# Patient Record
Sex: Female | Born: 1965 | Race: White | Hispanic: No | Marital: Married | Smoking: Never smoker
Health system: Southern US, Community
[De-identification: ages and names within clinical notes are randomized; demographics above are authoritative.]

## PROBLEM LIST (undated history)

## (undated) DIAGNOSIS — T7840XA Allergy, unspecified, initial encounter: Secondary | ICD-10-CM

## (undated) DIAGNOSIS — Z87440 Personal history of urinary (tract) infections: Secondary | ICD-10-CM

## (undated) HISTORY — DX: Allergy, unspecified, initial encounter: T78.40XA

## (undated) HISTORY — DX: Personal history of urinary (tract) infections: Z87.440

## (undated) HISTORY — PX: RHINOPLASTY: SUR1284

## (undated) HISTORY — PX: AUGMENTATION MAMMAPLASTY: SUR837

## (undated) HISTORY — PX: EYE SURGERY: SHX253

---

## 2016-07-07 ENCOUNTER — Emergency Department (HOSPITAL_COMMUNITY)
Admission: EM | Admit: 2016-07-07 | Discharge: 2016-07-07 | Disposition: A | Discharge status: Home / Self Care | Payer: No Typology Code available for payment source | Attending: Emergency Medicine | Admitting: Emergency Medicine

## 2016-07-07 ENCOUNTER — Emergency Department (HOSPITAL_COMMUNITY): Payer: No Typology Code available for payment source

## 2016-07-07 ENCOUNTER — Encounter (HOSPITAL_COMMUNITY): Payer: Self-pay

## 2016-07-07 DIAGNOSIS — S199XXA Unspecified injury of neck, initial encounter: Secondary | ICD-10-CM | POA: Insufficient documentation

## 2016-07-07 DIAGNOSIS — R1032 Left lower quadrant pain: Secondary | ICD-10-CM | POA: Diagnosis not present

## 2016-07-07 DIAGNOSIS — R519 Headache, unspecified: Secondary | ICD-10-CM

## 2016-07-07 DIAGNOSIS — Y999 Unspecified external cause status: Secondary | ICD-10-CM | POA: Diagnosis not present

## 2016-07-07 DIAGNOSIS — Y92410 Unspecified street and highway as the place of occurrence of the external cause: Secondary | ICD-10-CM | POA: Diagnosis not present

## 2016-07-07 DIAGNOSIS — Y939 Activity, unspecified: Secondary | ICD-10-CM | POA: Diagnosis not present

## 2016-07-07 DIAGNOSIS — M542 Cervicalgia: Secondary | ICD-10-CM

## 2016-07-07 DIAGNOSIS — R51 Headache: Secondary | ICD-10-CM | POA: Diagnosis not present

## 2016-07-07 LAB — I-STAT CHEM 8, ED
BUN: 9 mg/dL (ref 6–20)
CREATININE: 1 mg/dL (ref 0.44–1.00)
Calcium, Ion: 1.19 mmol/L (ref 1.15–1.40)
Chloride: 104 mmol/L (ref 101–111)
GLUCOSE: 94 mg/dL (ref 65–99)
HCT: 40 % (ref 36.0–46.0)
HEMOGLOBIN: 13.6 g/dL (ref 12.0–15.0)
POTASSIUM: 4.1 mmol/L (ref 3.5–5.1)
Sodium: 142 mmol/L (ref 135–145)
TCO2: 26 mmol/L (ref 0–100)

## 2016-07-07 MED ORDER — IOPAMIDOL (ISOVUE-300) INJECTION 61%
INTRAVENOUS | Status: AC
  Administered 2016-07-07: 100 mL
  Filled 2016-07-07: qty 100

## 2016-07-07 MED ORDER — IBUPROFEN 800 MG PO TABS
800.0000 mg | ORAL_TABLET | Freq: Once | ORAL | Status: AC
  Administered 2016-07-07: 800 mg via ORAL
  Filled 2016-07-07: qty 1

## 2016-07-07 MED ORDER — METHOCARBAMOL 500 MG PO TABS
500.0000 mg | ORAL_TABLET | Freq: Once | ORAL | Status: AC
  Administered 2016-07-07: 500 mg via ORAL
  Filled 2016-07-07: qty 1

## 2016-07-07 MED ORDER — IBUPROFEN 800 MG PO TABS: 800.0000 mg | ORAL_TABLET | Freq: Three times a day (TID) | ORAL | 0 refills | Status: AC | PRN

## 2016-07-07 MED ORDER — METHOCARBAMOL 500 MG PO TABS: 500.0000 mg | ORAL_TABLET | Freq: Two times a day (BID) | ORAL | 0 refills | Status: AC | PRN
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
## 2016-10-25 ENCOUNTER — Encounter: Payer: Self-pay | Admitting: Family Medicine

## 2017-09-14 ENCOUNTER — Encounter: Payer: Self-pay | Admitting: Family Medicine

## 2017-11-16 ENCOUNTER — Other Ambulatory Visit: Payer: Self-pay | Admitting: *Deleted

## 2017-11-16 ENCOUNTER — Other Ambulatory Visit: Payer: Self-pay | Admitting: Family Medicine

## 2017-11-16 DIAGNOSIS — Z1231 Encounter for screening mammogram for malignant neoplasm of breast: Secondary | ICD-10-CM

## 2017-12-06 ENCOUNTER — Ambulatory Visit
Admission: RE | Admit: 2017-12-06 | Discharge: 2017-12-06 | Disposition: A | Discharge status: Home / Self Care | Payer: Commercial Managed Care - PPO | Source: Ambulatory Visit | Attending: Family Medicine | Admitting: Family Medicine

## 2017-12-06 DIAGNOSIS — Z1231 Encounter for screening mammogram for malignant neoplasm of breast: Secondary | ICD-10-CM

## 2017-12-08 ENCOUNTER — Other Ambulatory Visit: Payer: Self-pay | Admitting: Family Medicine

## 2017-12-08 DIAGNOSIS — R928 Other abnormal and inconclusive findings on diagnostic imaging of breast: Secondary | ICD-10-CM

## 2017-12-13 ENCOUNTER — Other Ambulatory Visit: Payer: Commercial Managed Care - PPO

## 2017-12-15 ENCOUNTER — Ambulatory Visit
Admission: RE | Admit: 2017-12-15 | Discharge: 2017-12-15 | Disposition: A | Discharge status: Home / Self Care | Payer: Commercial Managed Care - PPO | Source: Ambulatory Visit | Attending: Family Medicine | Admitting: Family Medicine

## 2017-12-15 ENCOUNTER — Ambulatory Visit: Admission: RE | Admit: 2017-12-15 | Payer: Commercial Managed Care - PPO | Source: Ambulatory Visit

## 2017-12-15 DIAGNOSIS — R928 Other abnormal and inconclusive findings on diagnostic imaging of breast: Secondary | ICD-10-CM

## 2017-12-20 ENCOUNTER — Encounter: Payer: Self-pay | Admitting: Family Medicine

## 2017-12-20 ENCOUNTER — Ambulatory Visit (INDEPENDENT_AMBULATORY_CARE_PROVIDER_SITE_OTHER): Payer: Commercial Managed Care - PPO | Admitting: Family Medicine

## 2017-12-20 VITALS — BP 123/76 | HR 74 | Temp 98.3°F | Resp 12 | Ht 66.0 in | Wt 185.0 lb

## 2017-12-20 DIAGNOSIS — Z23 Encounter for immunization: Secondary | ICD-10-CM

## 2017-12-20 DIAGNOSIS — Z0000 Encounter for general adult medical examination without abnormal findings: Secondary | ICD-10-CM

## 2017-12-20 DIAGNOSIS — Z789 Other specified health status: Secondary | ICD-10-CM

## 2017-12-20 DIAGNOSIS — Z111 Encounter for screening for respiratory tuberculosis: Secondary | ICD-10-CM

## 2017-12-20 NOTE — Patient Instructions (Addendum)
A few things to remember from today's visit:   Routine general medical examination at a health care facility  Unknown varicella vaccination status - Plan: Varicella Zoster Antibody, IgG  Hepatitis B vaccination status unknown - Plan: Hepatitis B surface antibody, Hepatitis B surface antigen  Today you have you routine preventive visit.  At least 150 minutes of moderate exercise per week, daily brisk walking for 15-30 min is a good exercise option. Healthy diet low in saturated (animal) fats and sweets and consisting of fresh fruits and vegetables, lean meats such as fish and white chicken and whole grains.  These are some of recommendations for screening depending of age and risk factors:   - Vaccines:  Tdap vaccine every 10 years.  Shingles vaccine recommended at age 53, could be given after 53 years of age but not sure about insurance coverage.   Pneumonia vaccines:  Prevnar 13 at 65 and Pneumovax at 66. Sometimes Pneumovax is giving earlier if history of smoking, lung disease,diabetes,kidney disease among some.    Screening for diabetes at age 640 and every 3 years.  Cervical cancer prevention: Please schedule appt with gyn.  Pap smear starts at 53 years of age and continues periodically until 53 years old in low risk women. Pap smear every 3 years between 5321 and 53 years old. Pap smear every 3-5 years between women 30 and older if pap smear negative and HPV screening negative.   -Breast cancer: Mammogram: There is disagreement between experts about when to start screening in low risk asymptomatic female but recent recommendations are to start screening at 7640 and not later than 53 years old , every 1-2 years and after 53 yo q 2 years. Screening is recommended until 53 years old but some women can continue screening depending of healthy issues.   Colon cancer screening: starts at 53 years old until 53 years old. Stool cards annually.  Cholesterol disorder screening at age 53  and every 3 years.  Also recommended:  1. Dental visit- Brush and floss your teeth twice daily; visit your dentist twice a year. 2. Eye doctor- Get an eye exam at least every 2 years. 3. Helmet use- Always wear a helmet when riding a bicycle, motorcycle, rollerblading or skateboarding. 4. Safe sex- If you may be exposed to sexually transmitted infections, use a condom. 5. Seat belts- Seat belts can save your live; always wear one. 6. Smoke/Carbon Monoxide detectors- These detectors need to be installed on the appropriate level of your home. Replace batteries at least once a year. 7. Skin cancer- When out in the sun please cover up and use sunscreen 15 SPF or higher. 8. Violence- If anyone is threatening or hurting you, please tell your healthcare provider.  9. Drink alcohol in moderation- Limit alcohol intake to one drink or less per day. Never drink and drive.   Please be sure medication list is accurate. If a new problem present, please set up appointment sooner than planned today.

## 2017-12-20 NOTE — Progress Notes (Signed)
HPI:   Bonnie Carter is a 52 y.o. female, who is here today to establish care.  Former PCP: Integration medicine provider. Last preventive routine visit: 3-4 years ago.  She had labs done in 09/2017 "[facility] female panel" and FLP done 10/2016.  Chronic medical problems: Allergies,mild HLD, and UTI among some.  10/2016 TC 209, HDL 127,HDL 58,TG 121.   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  Last pap smear about 3 years ago,she is planning on arranging appt with her gyn. Colonoscopy: Never. Mammogram: 11/2017.  She is not sure about vaccination.  She exercises regularly but has not been consistent with a healthy diet.   Review of Systems  Constitutional: Negative for appetite change, fatigue and fever.  HENT: Negative for hearing loss, mouth sores, sore throat, trouble swallowing and voice change.   Eyes: Negative for redness and visual disturbance.  Respiratory: Negative for cough, shortness of breath and wheezing.   Cardiovascular: Negative for chest pain and leg swelling.  Gastrointestinal: Negative for abdominal pain, nausea and vomiting.       No changes in bowel habits.  Endocrine: Negative for cold intolerance, heat intolerance, polydipsia, polyphagia and polyuria.  Genitourinary: Negative for decreased urine volume, dysuria, hematuria, vaginal bleeding and vaginal discharge.  Musculoskeletal: Negative for gait problem and myalgias.  Skin: Negative for color change and rash.  Allergic/Immunologic: Positive for environmental allergies.  Neurological: Negative for syncope, weakness, numbness and headaches.  Psychiatric/Behavioral: Negative for confusion and sleep disturbance. The patient is not nervous/anxious.   All other systems reviewed and are negative.     Current Outpatient Medications on File Prior to Visit  Medication Sig Dispense Refill  . BIOTIN PO Take by  mouth daily.    . cholecalciferol (VITAMIN D) 1000 units tablet Take 5,000 Units by mouth daily.    Marland Kitchen CRANBERRY PO Take by mouth daily.    Marland Kitchen ibuprofen (ADVIL,MOTRIN) 800 MG tablet Take 1 tablet (800 mg total) by mouth every 8 (eight) hours as needed. 21 tablet 0  . methocarbamol (ROBAXIN) 500 MG tablet Take 1 tablet (500 mg total) by mouth 2 (two) times daily as needed for muscle spasms. (Patient not taking: Reported on 12/20/2017) 20 tablet 0   No current facility-administered medications on file prior to visit.      Past Medical History:  Diagnosis Date  . Allergy   . Hx: UTI (urinary tract infection)    Allergies  Allergen Reactions  . Bactrim [Sulfamethoxazole-Trimethoprim]     resistant  . Vicodin [Hydrocodone-Acetaminophen] Nausea And Vomiting    Family History  Problem Relation Age of Onset  . Arthritis Mother   . Diabetes Mother   . Hearing loss Mother   . Heart disease Mother   . Hyperlipidemia Mother   . Hypertension Mother   . Heart attack Mother   . Arthritis Father   . Hearing loss Father   . Heart disease Father   . Hyperlipidemia Father   . Hypertension Father   . Stroke Father   . Heart attack Father   . Heart disease Brother   . Heart disease Sister     Social History   Socioeconomic History  . Marital status: Married    Spouse name: None  . Number of children: 0  . Years of education: None  . Highest education level: None  Social Needs  . Financial resource strain:  None  . Food insecurity - worry: None  . Food insecurity - inability: None  . Transportation needs - medical: None  . Transportation needs - non-medical: None  Occupational History  . None  Tobacco Use  . Smoking status: Never Smoker  . Smokeless tobacco: Never Used  Substance and Sexual Activity  . Alcohol use: Yes  . Drug use: No  . Sexual activity: No    Partners: Male  Other Topics Concern  . None  Social History Narrative  . None    Vitals:   12/20/17 0913  BP:  123/76  Pulse: 74  Resp: 12  Temp: 98.3 F (36.8 C)  SpO2: 98%    Body mass index is 29.86 kg/m.   Physical Exam  Nursing note and vitals reviewed. Constitutional: She is oriented to person, place, and time. She appears well-developed. No distress.  HENT:  Head: Normocephalic and atraumatic.  Right Ear: Hearing, external ear and ear canal normal. Tympanic membrane is not erythematous.  Left Ear: Hearing, external ear and ear canal normal. Tympanic membrane is not erythematous.  Mouth/Throat: Uvula is midline, oropharynx is clear and moist and mucous membranes are normal.  Eyes: Conjunctivae and EOM are normal. Pupils are equal, round, and reactive to light.  Neck: No tracheal deviation present. No thyromegaly present.  Cardiovascular: Normal rate and regular rhythm.  No murmur heard. Pulses:      Dorsalis pedis pulses are 2+ on the right side, and 2+ on the left side.  Respiratory: Effort normal and breath sounds normal. No respiratory distress.  GI: Soft. She exhibits no mass. There is no hepatomegaly. There is no tenderness.  Musculoskeletal: She exhibits no edema.  No major deformity or signs of synovitis appreciated.  Lymphadenopathy:    She has no cervical adenopathy.       Right: No supraclavicular adenopathy present.       Left: No supraclavicular adenopathy present.  Neurological: She is alert and oriented to person, place, and time. She has normal strength. No cranial nerve deficit. Coordination and gait normal.  Reflex Scores:      Bicep reflexes are 2+ on the right side and 2+ on the left side.      Patellar reflexes are 2+ on the right side and 2+ on the left side. Skin: Skin is warm. No rash noted. No erythema.  Psychiatric: She has a normal mood and affect. Her speech is normal.  Well groomed, good eye contact.    ASSESSMENT AND PLAN:   [name] was seen today for establish care and annual exam.  Diagnoses and all orders for this visit:  Routine  general medical examination at a health care facility  We discussed the importance of regular physical activity and healthy diet for prevention of chronic illness and/or complications. Preventive guidelines reviewed.She is not interested in colonoscopy. Pending appt with gyn for ehr female preventive care. She has labs periodically with Dr Marcelline Deist. Vaccination updated. Hep B titers ordered. Ca++ and vit D supplementation recommended. Next CPE in a year.   Form completed.  Unknown varicella vaccination status -     Varicella Zoster Antibody, IgG  Hepatitis B vaccination status unknown -     Hepatitis B surface antibody -     Hepatitis B surface antigen  Need for MMR vaccine -     MMR vaccine subcutaneous  Need for Tdap vaccination -     Tdap vaccine greater than or equal to 7yo IM  Screening-pulmonary TB -  Quantiferon tb gold assay; Future     Betty G. Martinique, MD  [facility] [place] office.

## 2017-12-21 LAB — VARICELLA ZOSTER ANTIBODY, IGG: Varicella IgG: 1173 index

## 2017-12-21 LAB — HEPATITIS B SURFACE ANTIBODY,QUALITATIVE: Hep B S Ab: REACTIVE — AB

## 2017-12-21 LAB — HEPATITIS B SURFACE ANTIGEN: Hepatitis B Surface Ag: NONREACTIVE

## 2017-12-25 ENCOUNTER — Encounter: Payer: Self-pay | Admitting: Family Medicine

## 2017-12-25 NOTE — Progress Notes (Signed)
Hepatitis B and varicella positive ab,so she does not need vaccination. TB test was not done. She can come to the lab this week, I placed future lab order.  Thanks

## 2017-12-28 ENCOUNTER — Other Ambulatory Visit (INDEPENDENT_AMBULATORY_CARE_PROVIDER_SITE_OTHER): Payer: Commercial Managed Care - PPO

## 2017-12-28 DIAGNOSIS — Z111 Encounter for screening for respiratory tuberculosis: Secondary | ICD-10-CM

## 2017-12-30 ENCOUNTER — Encounter: Payer: Self-pay | Admitting: Family Medicine

## 2017-12-30 LAB — QUANTIFERON-TB GOLD PLUS
Mitogen-NIL: >10 IU/mL
NIL: 0.01 [IU]/mL
QuantiFERON-TB Gold Plus: NEGATIVE
TB1-NIL: 0 IU/mL
TB2-NIL: 0.01 [IU]/mL

## 2018-01-18 ENCOUNTER — Telehealth: Payer: Self-pay | Admitting: Family Medicine

## 2018-01-18 ENCOUNTER — Ambulatory Visit: Payer: Commercial Managed Care - PPO

## 2018-01-18 NOTE — Telephone Encounter (Signed)
Please call patient when MMR vaccine arrives here in office to schedule injection.

## 2018-01-19 NOTE — Telephone Encounter (Signed)
I spoke with pt and put on nurse schedule for Friday 01/20/2018, vaccine just arrived in office.

## 2018-01-20 ENCOUNTER — Ambulatory Visit (INDEPENDENT_AMBULATORY_CARE_PROVIDER_SITE_OTHER): Payer: Commercial Managed Care - PPO | Admitting: Family Medicine

## 2018-01-20 DIAGNOSIS — Z23 Encounter for immunization: Secondary | ICD-10-CM | POA: Diagnosis not present

## 2018-12-20 ENCOUNTER — Other Ambulatory Visit: Payer: Self-pay | Admitting: Family Medicine

## 2018-12-20 DIAGNOSIS — Z1231 Encounter for screening mammogram for malignant neoplasm of breast: Secondary | ICD-10-CM

## 2019-01-15 ENCOUNTER — Ambulatory Visit: Payer: Commercial Managed Care - PPO

## 2019-02-19 ENCOUNTER — Encounter: Payer: Self-pay | Admitting: Podiatry

## 2019-02-19 ENCOUNTER — Ambulatory Visit (INDEPENDENT_AMBULATORY_CARE_PROVIDER_SITE_OTHER): Payer: 59 | Admitting: Podiatry

## 2019-02-19 ENCOUNTER — Other Ambulatory Visit: Payer: Self-pay

## 2019-02-19 VITALS — BP 116/78 | HR 85 | Temp 97.2°F | Resp 16

## 2019-02-19 DIAGNOSIS — M79676 Pain in unspecified toe(s): Secondary | ICD-10-CM

## 2019-02-19 DIAGNOSIS — L602 Onychogryphosis: Secondary | ICD-10-CM | POA: Diagnosis not present

## 2019-02-19 DIAGNOSIS — L601 Onycholysis: Secondary | ICD-10-CM | POA: Diagnosis not present

## 2019-02-19 NOTE — Patient Instructions (Signed)

## 2019-02-19 NOTE — Progress Notes (Signed)
  Subjective:  Patient ID: Bonnie Carter, female    DOB: 1965/10/28,  MRN: [id number]  No chief complaint on file.   53 y.o. female presents with the above complaint. Reports pain and redness to the right great toe nail bed. Present for 1 week. Pain at 7/10. Aching and throbbing in nature. Denies injury. Worse with shoes and socks. Reports warmth . Was given Clinda by urgent care. Tried icing and epsom salt soaks without relief.  Review of Systems: Negative except as noted in the HPI. Denies N/V/F/Ch.  Past Medical History:  Diagnosis Date  . Allergy   . Hx: UTI (urinary tract infection)     Current Outpatient Medications:  .  BIOTIN PO, Take by mouth daily., Disp: , Rfl:  .  cholecalciferol (VITAMIN D) 1000 units tablet, Take 5,000 Units by mouth daily., Disp: , Rfl:  .  CRANBERRY PO, Take by mouth daily., Disp: , Rfl:  .  ibuprofen (ADVIL,MOTRIN) 800 MG tablet, Take 1 tablet (800 mg total) by mouth every 8 (eight) hours as needed., Disp: 21 tablet, Rfl: 0 .  methocarbamol (ROBAXIN) 500 MG tablet, Take 1 tablet (500 mg total) by mouth 2 (two) times daily as needed for muscle spasms. (Patient not taking: Reported on 12/20/2017), Disp: 20 tablet, Rfl: 0  Social History   Tobacco Use  Smoking Status Never Smoker  Smokeless Tobacco Never Used    Allergies  Allergen Reactions  . Bactrim [Sulfamethoxazole-Trimethoprim]     resistant  . Vicodin [Hydrocodone-Acetaminophen] Nausea And Vomiting   Objective:  There were no vitals filed for this visit. There is no height or weight on file to calculate BMI. Constitutional Well developed. Well nourished.  Vascular Dorsalis pedis pulses palpable bilaterally. Posterior tibial pulses palpable bilaterally. Capillary refill normal to all digits.  No cyanosis or clubbing noted. Pedal hair growth normal.  Neurologic Normal speech. Oriented to person, place, and time. Epicritic sensation to light touch grossly present bilaterally.   Dermatologic R hallux nail with eponyhcial inflammation, pain to palpation, lysis noted of nail proximally. Trasnverse ridges noted to both great toenails. No other open wounds. No skin lesions.  Orthopedic: Normal joint ROM without pain or crepitus bilaterally. No visible deformities. No bony tenderness.   Radiographs: None Assessment:   1. Onycholysis   2. Pain around toenail   3. Onychauxis    Plan:  Patient was evaluated and treated and all questions answered.  Lysis of Nail, right -Patient agrees to proceed with minor pricedure to remove the toenail today. Consent reviewed and signed by patient. -Ingrown nail excised. See procedure note. -Educated on post-procedure care including soaking. Written instructions provided and reviewed. -Patient to follow up in 2 weeks for nail check.  Procedure: Avulsion of toenail Location: Right 1st  Anesthesia: Lidocaine 1% plain; 1.5 mL and Marcaine 0.5% plain; 1.5 mL, digital block. Skin Prep: Betadine. Dressing: Silvadene; telfa; dry, sterile, compression dressing. Technique: Following skin prep, the toe was exsanguinated and a tourniquet was secured at the base of the toe. The nail was freed and avulsed with a hemostat. The area was cleansed. The tourniquet was then removed and sterile dressing applied. Disposition: Patient tolerated procedure well.     No follow-ups on file.

## 2019-02-22 ENCOUNTER — Telehealth: Payer: Self-pay | Admitting: *Deleted

## 2019-02-22 ENCOUNTER — Ambulatory Visit
Admission: RE | Admit: 2019-02-22 | Discharge: 2019-02-22 | Disposition: A | Discharge status: Home / Self Care | Payer: 59 | Source: Ambulatory Visit | Attending: Family Medicine | Admitting: Family Medicine

## 2019-02-22 ENCOUNTER — Other Ambulatory Visit: Payer: Self-pay

## 2019-02-22 DIAGNOSIS — Z1231 Encounter for screening mammogram for malignant neoplasm of breast: Secondary | ICD-10-CM

## 2019-02-22 NOTE — Telephone Encounter (Signed)
Pt states the epsom salt soaks are too painful and would like an alternate solution. I instructed pt to use betadine 1 tablespoon in 1 quart warm water with the same instructions as given at her appt or 1 tablespoon of antibacterial soap in 1 quart warm water and rinse well.

## 2019-02-28 ENCOUNTER — Ambulatory Visit: Payer: Commercial Managed Care - PPO

## 2019-03-09 ENCOUNTER — Other Ambulatory Visit: Payer: Self-pay

## 2019-03-12 ENCOUNTER — Other Ambulatory Visit: Payer: Self-pay

## 2019-03-12 ENCOUNTER — Ambulatory Visit (INDEPENDENT_AMBULATORY_CARE_PROVIDER_SITE_OTHER): Payer: 59 | Admitting: Podiatry

## 2019-03-12 ENCOUNTER — Encounter: Payer: Self-pay | Admitting: Podiatry

## 2019-03-12 VITALS — Temp 98.8°F | Resp 16

## 2019-03-12 DIAGNOSIS — L601 Onycholysis: Secondary | ICD-10-CM

## 2019-03-12 NOTE — Progress Notes (Signed)
  Subjective:  Patient ID: Bonnie Carter, female    DOB: [birth year],  MRN: [id number]  Chief Complaint  Patient presents with  . nail check    F/U rt hallux nail check pt. states," doing great." Tx: none -pt states she completed epsom Carter soakings and betadine -pt dneis redness/swelling/drainage/pain   53 y.o. female returns for the above complaint. Had alittle pain after the procedure and pain with soaking of the nail but otherwise denies problems  Objective:   General AA&O x3. Normal mood and affect.  Vascular Foot warm and well perfused with good capillary refill.  Neurologic Sensation grossly intact.  Dermatologic Nail avulsion site healing well without drainage or erythema. Nail bed with overlying soft crust. Left intact. No signs of local infection.  Orthopedic: No tenderness to palpation of the toe.   Assessment & Plan:  Patient was evaluated and treated and all questions answered.  S/p Ingrown Toenail Excision, right -Healing well without issue. -Discussed return precautions. -F/u PRN

## 2019-10-02 ENCOUNTER — Ambulatory Visit: Payer: 59 | Attending: Internal Medicine

## 2019-10-02 DIAGNOSIS — Z20822 Contact with and (suspected) exposure to covid-19: Secondary | ICD-10-CM

## 2019-10-03 LAB — NOVEL CORONAVIRUS, NAA: SARS-CoV-2, NAA: DETECTED — AB

## 2019-10-04 ENCOUNTER — Ambulatory Visit: Payer: Self-pay | Admitting: Family Medicine

## 2019-10-04 ENCOUNTER — Telehealth: Payer: Self-pay | Admitting: *Deleted

## 2019-10-04 NOTE — Telephone Encounter (Signed)
Pt returned call to discuss covid 19 test results. She understands that she is positive for the virus. Pt advised to quarantine for 10 to 14 days. May come out of quarantine if no fever without taking antipyretics (fever medicine) and symptoms have decreased at 10 days. Temperature should be within normal limits. Reviewed signs and symptoms of covid.  She has had a low grade fever, cold like symptoms, loss of sense of smell. She should treat symptoms with OTC medications. You should also notify your PCP. Get fresh air, hydrated and get rest. Should be able to go outside your house, alone. Call 911 for respiratory distress (struggling to breath).  Remember to wear your mask, stay at home, social distant and clean the hard surfaces in your house. And all family members if they have not been tested, should be tested. Pt voiced understanding.

## 2019-10-04 NOTE — Telephone Encounter (Signed)
See result note.  

## 2020-04-24 ENCOUNTER — Other Ambulatory Visit: Payer: Self-pay | Admitting: Family Medicine

## 2020-04-24 DIAGNOSIS — Z1231 Encounter for screening mammogram for malignant neoplasm of breast: Secondary | ICD-10-CM

## 2020-04-30 ENCOUNTER — Other Ambulatory Visit: Payer: Self-pay | Admitting: Family Medicine

## 2020-04-30 DIAGNOSIS — Z1231 Encounter for screening mammogram for malignant neoplasm of breast: Secondary | ICD-10-CM

## 2020-05-01 ENCOUNTER — Ambulatory Visit: Payer: 59

## 2020-05-02 ENCOUNTER — Ambulatory Visit: Payer: 59

## 2020-05-05 ENCOUNTER — Ambulatory Visit
Admission: RE | Admit: 2020-05-05 | Discharge: 2020-05-05 | Disposition: A | Discharge status: Home / Self Care | Payer: 59 | Source: Ambulatory Visit

## 2020-05-05 ENCOUNTER — Other Ambulatory Visit: Payer: Self-pay

## 2020-05-05 DIAGNOSIS — Z1231 Encounter for screening mammogram for malignant neoplasm of breast: Secondary | ICD-10-CM

## 2022-01-02 IMAGING — MG DIGITAL SCREENING BREAST BILAT IMPLANT W/ TOMO W/ CAD
8 of 14 series · 8 of 34 positions shown · non-contrast
Comparison: Previous exam(s).

CLINICAL DATA: Screening.

EXAM:
DIGITAL SCREENING BILATERAL MAMMOGRAM WITH IMPLANTS, CAD AND TOMO
The patient has retropectoral implants. Standard and implant
displaced views were performed.

[L MLO]
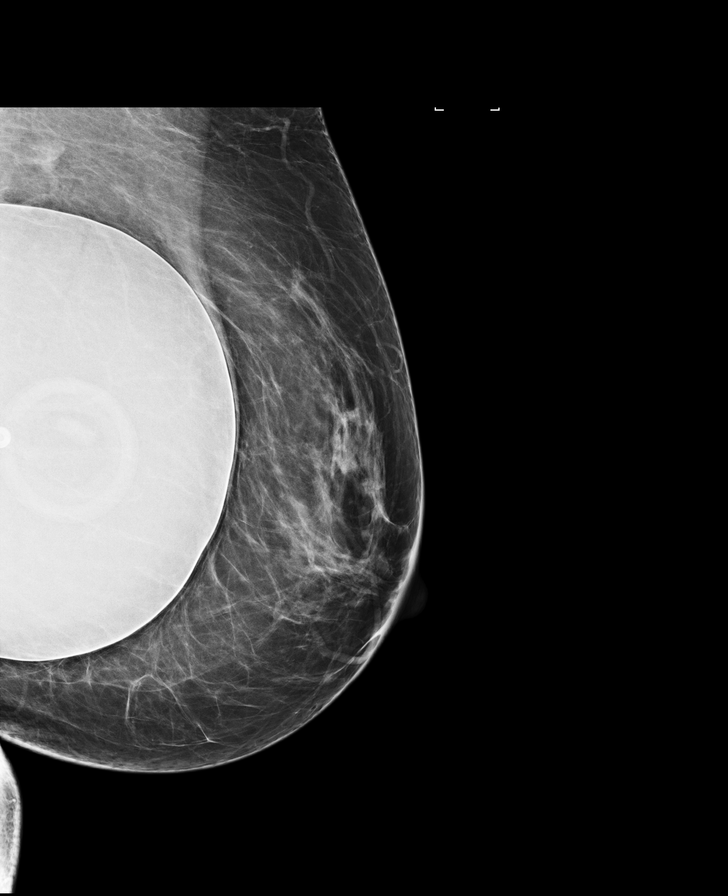

[R MLO]
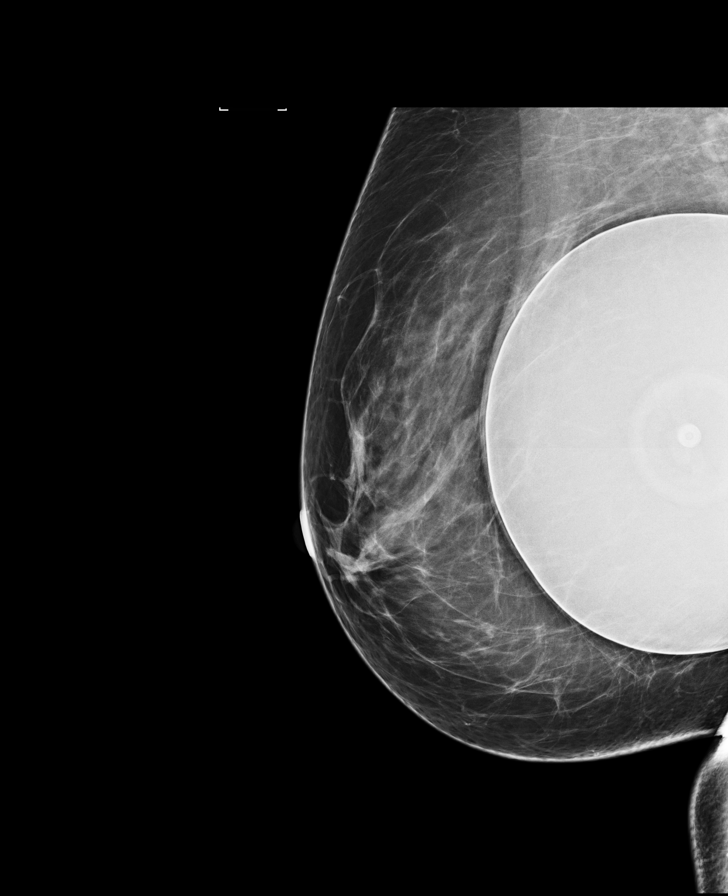

[R CC]
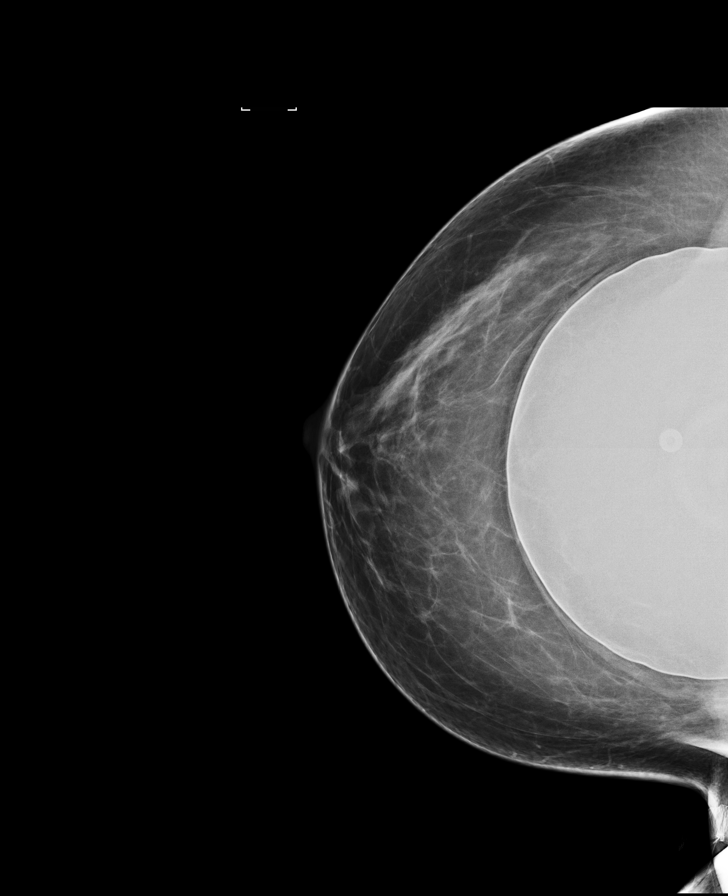

[L CC]
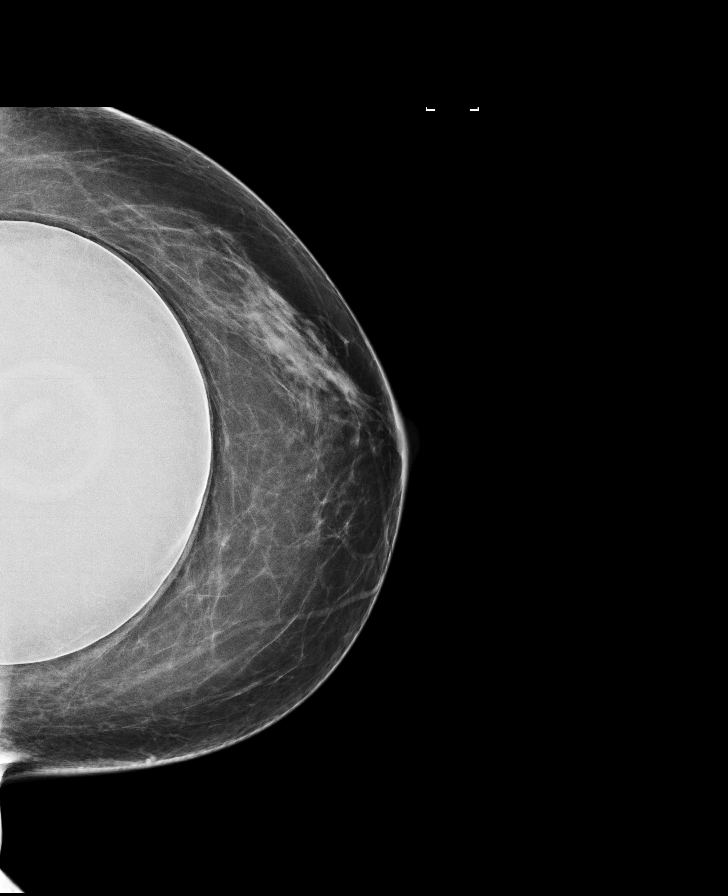

[L MLO synth-2D]
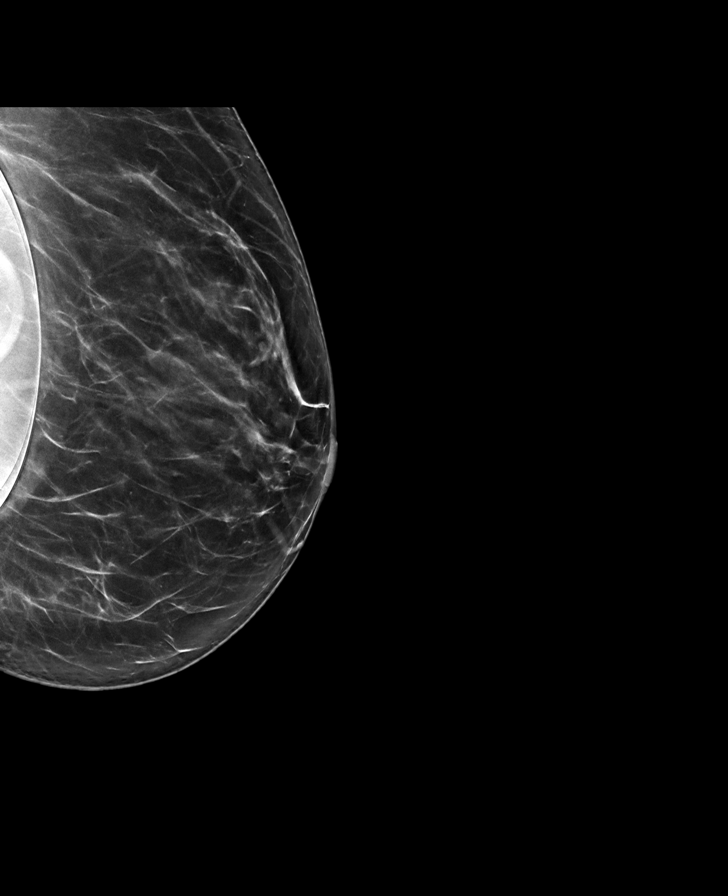

[R CC synth-2D (1 of 2)]
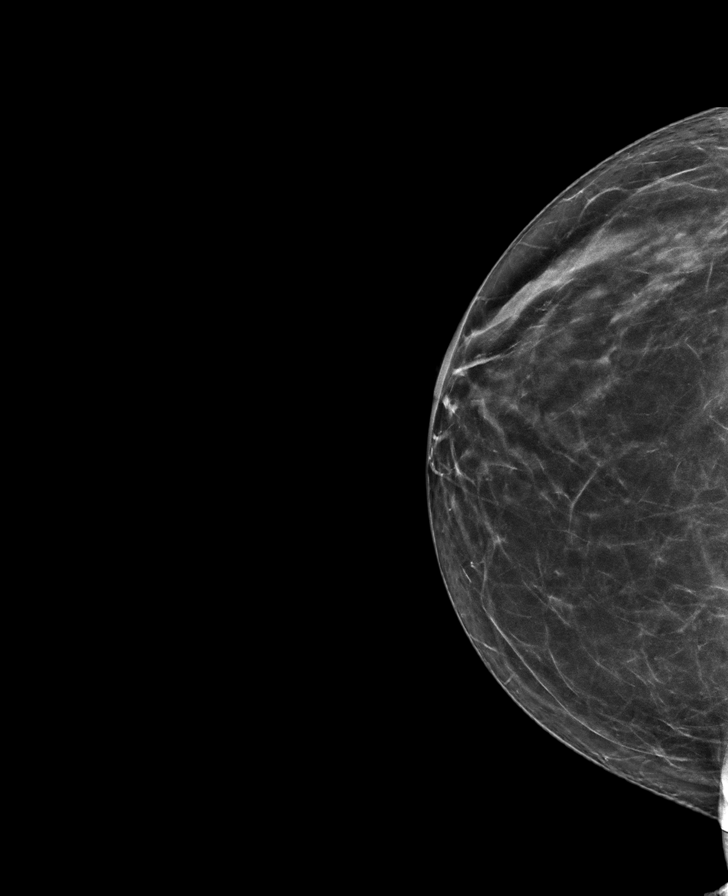

[R CC synth-2D (2 of 2)]
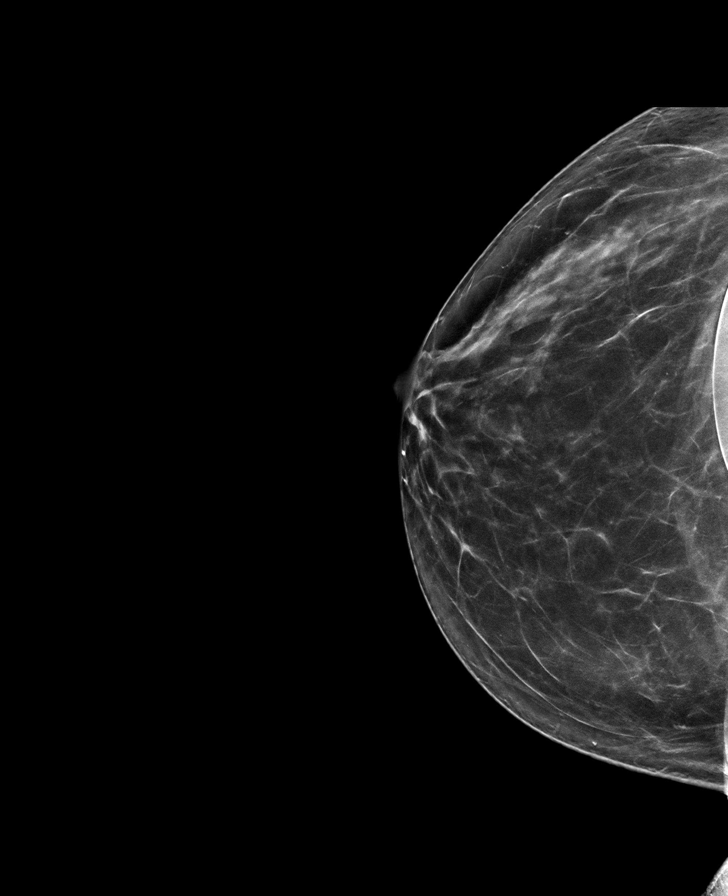

[L CC synth-2D]
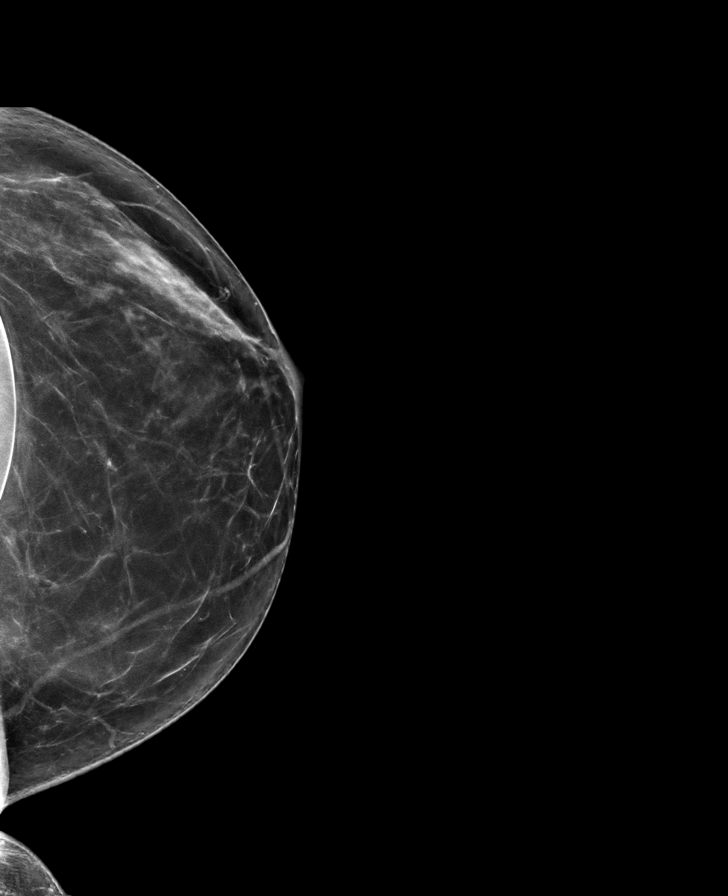

[8 of 34 positions shown; findings below may reference images not displayed]

ACR Breast Density Category b: There are scattered areas of
fibroglandular density.
FINDINGS: There are no findings suspicious for malignancy. Images were
processed with CAD.
IMPRESSION: No mammographic evidence of malignancy. A result letter of this
screening mammogram will be mailed directly to the patient.

RECOMMENDATION:
Screening mammogram in one year. (Code:60-T-8Z4)

BI-RADS CATEGORY  1:  Negative.
